# Patient Record
Sex: Male | Born: 1950 | Race: White | Hispanic: No | Marital: Single | Smoking: Former smoker
Health system: Southern US, Community
[De-identification: ages and names within clinical notes are randomized; demographics above are authoritative.]

## PROBLEM LIST (undated history)

## (undated) DIAGNOSIS — G4730 Sleep apnea, unspecified: Secondary | ICD-10-CM

## (undated) DIAGNOSIS — J4489 Other specified chronic obstructive pulmonary disease: Secondary | ICD-10-CM

## (undated) DIAGNOSIS — I10 Essential (primary) hypertension: Secondary | ICD-10-CM

## (undated) DIAGNOSIS — R911 Solitary pulmonary nodule: Secondary | ICD-10-CM

## (undated) DIAGNOSIS — E785 Hyperlipidemia, unspecified: Secondary | ICD-10-CM

## (undated) DIAGNOSIS — J449 Chronic obstructive pulmonary disease, unspecified: Secondary | ICD-10-CM

## (undated) HISTORY — DX: Solitary pulmonary nodule: R91.1

## (undated) HISTORY — DX: Sleep apnea, unspecified: G47.30

## (undated) HISTORY — PX: OTHER SURGICAL HISTORY: SHX169

## (undated) HISTORY — DX: Other specified chronic obstructive pulmonary disease: J44.89

## (undated) HISTORY — PX: ROTATOR CUFF REPAIR: SHX139

## (undated) HISTORY — DX: Essential (primary) hypertension: I10

## (undated) HISTORY — DX: Hyperlipidemia, unspecified: E78.5

## (undated) HISTORY — PX: APPENDECTOMY: SHX54

## (undated) HISTORY — DX: Chronic obstructive pulmonary disease, unspecified: J44.9

---

## 2007-04-23 ENCOUNTER — Ambulatory Visit (HOSPITAL_COMMUNITY): Admission: RE | Admit: 2007-04-23 | Discharge: 2007-04-24 | Payer: Self-pay | Admitting: Orthopaedic Surgery

## 2007-04-23 ENCOUNTER — Ambulatory Visit: Payer: Self-pay | Admitting: Emergency Medicine

## 2007-05-13 ENCOUNTER — Ambulatory Visit: Payer: Self-pay | Admitting: Emergency Medicine

## 2007-05-13 DIAGNOSIS — J984 Other disorders of lung: Secondary | ICD-10-CM

## 2007-05-13 DIAGNOSIS — J449 Chronic obstructive pulmonary disease, unspecified: Secondary | ICD-10-CM

## 2007-05-13 DIAGNOSIS — G4730 Sleep apnea, unspecified: Secondary | ICD-10-CM | POA: Insufficient documentation

## 2007-05-13 DIAGNOSIS — J4489 Other specified chronic obstructive pulmonary disease: Secondary | ICD-10-CM | POA: Insufficient documentation

## 2007-05-13 DIAGNOSIS — E785 Hyperlipidemia, unspecified: Secondary | ICD-10-CM

## 2007-05-13 DIAGNOSIS — I10 Essential (primary) hypertension: Secondary | ICD-10-CM | POA: Insufficient documentation

## 2007-05-14 ENCOUNTER — Ambulatory Visit: Payer: Self-pay | Admitting: Pulmonary Disease

## 2007-05-14 ENCOUNTER — Ambulatory Visit (HOSPITAL_BASED_OUTPATIENT_CLINIC_OR_DEPARTMENT_OTHER): Admission: RE | Admit: 2007-05-14 | Discharge: 2007-05-14 | Payer: Self-pay | Admitting: Emergency Medicine

## 2007-05-20 ENCOUNTER — Ambulatory Visit: Payer: Self-pay | Admitting: Emergency Medicine

## 2007-07-13 ENCOUNTER — Ambulatory Visit: Payer: Self-pay | Admitting: Emergency Medicine

## 2010-05-07 ENCOUNTER — Ambulatory Visit
Admission: RE | Admit: 2010-05-07 | Discharge: 2010-05-07 | Payer: Self-pay | Source: Home / Self Care | Attending: Emergency Medicine | Admitting: Emergency Medicine

## 2010-05-07 ENCOUNTER — Encounter: Payer: Self-pay | Admitting: Emergency Medicine

## 2010-05-07 DIAGNOSIS — R079 Chest pain, unspecified: Secondary | ICD-10-CM | POA: Insufficient documentation

## 2010-05-08 ENCOUNTER — Telehealth (INDEPENDENT_AMBULATORY_CARE_PROVIDER_SITE_OTHER): Payer: Self-pay | Admitting: *Deleted

## 2010-05-17 NOTE — Medication Information (Signed)
Summary: Enrollment form for [facility]  Enrollment form for [employer]   Imported By: Sherian Rein 05/11/2010 11:41:24  _____________________________________________________________________  External Attachment:    Type:   Image     Comment:   External Document

## 2010-05-17 NOTE — Progress Notes (Signed)
Summary: ?s from yesterday ov  Phone Note Call from Patient Call back at Home Phone [phone]   Caller: financee//kim [email or web address] Call For: byrum Reason for Call: Talk to Nurse Summary of Call: Wants to speak to nurse in ref to pt's ov yesterday. Initial call taken by: Darletta Moll,  May 08, 2010 3:10 PM  Follow-up for Phone Call        [place], spoke with Selena Batten. She is concerned bc pt is hurting in chest and having increased SOB but cxr showed lung noduled was no bigger.  She would like to know if Dr. Delton Coombes could give her a general prognosis/approx life expectancy for pt's condition where he is at now.  Advised I would ask RB if he is comfortable with this.    Also, cardiology referral was supposed to be place.  Pls advise if anyone in particular you want to refer pt to so I can place order.  Thanks! Follow-up by: Gweneth Dimitri RN,  May 08, 2010 3:38 PM  Additional Follow-up for Phone Call Additional follow up Details #1::        Spoke to [facility]. Reviewed CXR results - stable nodule. She is concerned about severity of disease, limitations he might expect. He has been using CPAP every night since our OV, is taking Spiriva once daily as a trial. Reviewed that he will need pneumovax and flu vax.  Additional Follow-up by: Leslye Peer MD,  May 10, 2010 11:26 AM

## 2010-05-17 NOTE — Assessment & Plan Note (Signed)
Summary: COPD, OSA, nodules, CP   Visit Type:  Follow-up  CC:  COPD.  OSA.  Last OV 07/13/2007.  Pt c/o incr SOB w/ exertion and at night while sleeping.  No cpap use x3-4 months..  History of Present Illness: 60 yo longtime smoker, COPD. Also w OSA, hx RLL and ? LLL nodules on prior CXR.   PFT - mod to severe COPD PSG - severe OSA, needs CPAP 9 + O2 [facility].   ROV 05/07/10 -- returns for eval, last time seen 3/09. COPD and OSA. Lost his insurance, hasn't been getting his meds for over a yr. Formerly on [employer]. Currently using wife's Proventil about once daily. Hasn't been using CPAP for about the last 6 months.   ROS - has had progressive SOB for 6 mo, significantly worse last 2-3 mo. He is having more exertional SOB, can't walk thru the store w his spouse. Has to stop to rest. No real wheeze, no real cough. He describes a tightness in his chest that goes to back - can happen with working, also when laying down. Has been waked from sleep feeling chest pressure, pain down arms. Snores without the CPAP. Feels that his heart races. Has gained about 15 lbs since last visit.     Preventive Screening-Counseling & Management  Alcohol-Tobacco     Smoking Status: quit     Packs/Day: 2.0     Year Started: 1958     Year Quit: 2002  Current Medications (verified): 1)  Oxygen ...Marland Kitchen 2lwhile Sleeping 2)  Aspirin 325 Mg Tabs (Aspirin) .Marland Kitchen.. 1 By Mouth Daily 3)  Aleve 220 Mg Tabs (Naproxen Sodium) .... As Needed  Allergies (verified): No Known Drug Allergies  Social History: Packs/Day:  2.0  Vital Signs:  Patient profile:   60 year old male Height:      68 inches (172.72 cm) Weight:      227.13 pounds (103.24 kg) BMI:     34.66 O2 Sat:      93 % on Room air Temp:     98.6 degrees F (37.00 degrees C) oral Pulse rate:   71 / minute BP sitting:   124 / 76  (left arm) Cuff size:   large  Vitals Entered By: Michel Bickers CMA (May 07, 2010 1:26 PM)  O2 Sat at Rest %:  93 O2  Flow:  Room air  Serial Vital Signs/Assessments:  Comments: 2:26 PM Ambulatory Pulse Oximetry  Resting; HR__70___    02 Sat__93%ra___  Lap1 (185 feet)   HR__104___   02 Sat__91%ra___ Lap2 (185 feet)   HR__93___   02 Sat__91%ra___    Lap3 (185 feet)   HR_____   02 Sat_____  ___Test Completed without Difficulty _x__Test Stopped due ZO:[id number] SOB   By: Vernie Murders   CC: COPD.  OSA.  Last OV 07/13/2007.  Pt c/o incr SOB w/ exertion and at night while sleeping.  No cpap use x3-4 months. Is Patient Diabetic? No Comments Medications reviewed with patient Michel Bickers CMA  May 07, 2010 1:37 PM   Physical Exam  General:  normal appearance and healthy appearing.  obese.   Head:  normocephalic and atraumatic Eyes:  conjunctiva and sclera clear Nose:  no deformity, discharge, inflammation, or lesions Mouth:  no deformity or lesions Neck:  no masses, thyromegaly, or abnormal cervical nodes Chest Wall:  no deformities noted Lungs:  distant but clear, no wheezes Heart:  regular rate and rhythm, S1, S2 without murmurs, rubs, gallops, or clicks  Pulses:  pulses normal Extremities:  no edema Psych:  alert and cooperative normal attention span and concentration, anxious.     Impression & Recommendations:  Problem # 1:  C O P D (ICD-496) Untreated.  - trial Spiriva once daily  - ? financial assist w/ Spiriva - walking oximetry today  Problem # 2:  SLEEP APNEA (ICD-780.57)  - encouraged him to restart CPAP 9cmH2O  Orders: Est. Patient Level IV (16109)  Problem # 3:  PULMONARY NODULE (ICD-518.89)  - repeat CXR today, may need CXR to further eval  Orders: Est. Patient Level IV (99214) T-2 View CXR (71020TC)  Problem # 4:  CHEST PAIN (ICD-786.50)  Etiology unclear, but he does have risk factors for CAD - will refer to cardiology for eval  Orders: Est. Patient Level IV (60454)  Medications Added to Medication List This Visit: 1)  Oxygen  ...Marland Kitchen 2lwhile  sleeping 2)  Aspirin 325 Mg Tabs (Aspirin) .Marland Kitchen.. 1 by mouth daily 3)  Aleve 220 Mg Tabs (Naproxen sodium) .... As needed  Other Orders: DME Referral (DME) Pulmonary Referral (Pulmonary)  Patient Instructions: 1)  Walking oximetry today showed that you do not need to wear with simple walking, buut you should use with heavier exertion.  2)  Start Spiriva 1 inhalation once daily until our next visit 3)  Start using your CPAP again every night 4)  We will refer you to cardiology to evaluate your chest pain 5)  CXR today 6)  Follow up with Dr Delton Coombes in 1 month.

## 2010-05-18 ENCOUNTER — Ambulatory Visit (INDEPENDENT_AMBULATORY_CARE_PROVIDER_SITE_OTHER): Payer: Self-pay | Admitting: Cardiology

## 2010-05-18 ENCOUNTER — Encounter: Payer: Self-pay | Admitting: Cardiology

## 2010-05-18 DIAGNOSIS — R072 Precordial pain: Secondary | ICD-10-CM

## 2010-05-18 DIAGNOSIS — I10 Essential (primary) hypertension: Secondary | ICD-10-CM

## 2010-05-23 NOTE — Assessment & Plan Note (Signed)
Summary: chest pain/libby ext 823/self pay/pt [phone]/ref byrum confi...   Vital Signs:  Patient profile:   60 year old male Height:      68 inches Weight:      228 pounds BMI:     34.79 Pulse rate:   61 / minute Resp:     14 per minute BP sitting:   138 / 82  (left arm)  Vitals Entered By: Kem Parkinson (May 18, 2010 9:03 AM)  CC:  pt compalins of chest pain pt took asprin which helped.  History of Present Illness: 60 year old male for evaluation of chest pain. No prior cardiac history. Patient states that in the past month he has had occasional chest pain. It is substernal and radiates to his arms bilaterally. There is no associated symptoms. It occurs at night. He finds it difficult to describe. It resolved in 15 minutes after taking aspirin. He does not have exertional chest pain. He does have dyspnea on exertion which he lungs. There is no orthopnea, PND, pedal edema or syncope. Because of the above we were asked to further evaluate.  Current Medications (verified): 1)  Oxygen ...Marland Kitchen 2lwhile Sleeping 2)  Aspirin 325 Mg Tabs (Aspirin) .Marland Kitchen.. 1 By Mouth Daily 3)  Aleve 220 Mg Tabs (Naproxen Sodium) .... As Needed  Allergies: No Known Drug Allergies  Past History:  Past Medical History: HYPERTENSION  HYPERLIPIDEMIA  PULMONARY NODULE  C O P D SLEEP APNEA   Past Surgical History: Shoulder SGY L rotator cuff R ankle fx and pinning Sinus SGY Appendectomy  Family History: Reviewed history from 05/13/2007 and no changes required. mat grandfather and uncle-emphysema mother-lung No premature CAD  Social History: Reviewed history from 05/13/2007 and no changes required. Patient states former smoker.  Pt is divorced with children.   Pt is a Animator. Moderate ETOH  Review of Systems       no fevers or chills, productive cough, hemoptysis, dysphasia, odynophagia, melena, hematochezia, dysuria, hematuria, rash, seizure activity, orthopnea, PND, pedal  edema, claudication. Remaining systems are negative.   Physical Exam  General:  Well developed/well nourished in NAD Skin warm/dry Patient not depressed No peripheral clubbing Back-normal HEENT-normal/normal eyelids Neck supple/normal carotid upstroke bilaterally; no bruits; no JVD; no thyromegaly chest - CTA/ normal expansion CV - RRR/normal S1 and S2; no murmurs, rubs or gallops;  PMI nondisplaced Abdomen -NT/ND, no HSM, no mass, + bowel sounds, no bruit 2+ femoral pulses, no bruits Ext-no edema, chords, 2+ DP Neuro-grossly nonfocal     Impression & Recommendations:  Problem # 1:  CHEST PAIN (ICD-786.50) Symptoms concerning. Multiple risk factors and abnormal electrocardiogram. I recommended cardiac catheterization today or definitive evaluation. However he declined at this point as he is concerned about the cost and also the potential complications. I explained the risk of the procedure and also the risk of undiagnosed coronary disease including death and myocardial infarction. I also offered a stress test as a possible alternative. He declined both. He will contact us if he changes his mind. We will continue with his aspirin and add  Lopressor 12.5 mg p.o. b.i.d. We will increase as tolerated. Follow pulmonary status closely following initiation. His updated medication list for this problem includes:    Aspirin 325 Mg Tabs (Aspirin) .Marland Kitchen... 1 by mouth daily    Metoprolol Tartrate 25 Mg Tabs (Metoprolol tartrate) .Marland Kitchen... Take one half  tablet by mouth twice a day  Problem # 2:  HYPERTENSION (ICD-401.9) Add Lopressor as described above. His updated  medication list for this problem includes:    Aspirin 325 Mg Tabs (Aspirin) .Marland Kitchen... 1 by mouth daily    Metoprolol Tartrate 25 Mg Tabs (Metoprolol tartrate) .Marland Kitchen... Take one half  tablet by mouth twice a day  Problem # 3:  HYPERLIPIDEMIA (ICD-272.4) Management per primary care.  Problem # 4:  C O P D (ICD-496) Followed by  pulmonary.  Problem # 5:  SLEEP APNEA (ICD-780.57)  Patient Instructions: 1)  Your physician has recommended you make the following change in your medication: START METOPROLOL TART 25MG  1/2 TABLET TWICE DAILY 2)  Your physician wants you to follow-up in:3 MONTHS   You will receive a reminder letter in the mail two months in advance. If you don't receive a letter, please call our office to schedule the follow-up appointment. 3)  Your physician has requested that you have a cardiac catheterization.  Cardiac catheterization is used to diagnose and/or treat various heart conditions. Doctors may recommend this procedure for a number of different reasons. The most common reason is to evaluate chest pain. Chest pain can be a symptom of coronary artery disease (CAD), and cardiac catheterization can show whether plaque is narrowing or blocking your heart's arteries. This procedure is also used to evaluate the valves, as well as measure the blood flow and oxygen levels in different parts of your heart.  For further information please visit [email or web address]  Please follow instruction sheet, as given. Prescriptions: METOPROLOL TARTRATE 25 MG TABS (METOPROLOL TARTRATE) Take one half  tablet by mouth twice a day  #60 x 12   Entered by:   Deliah Goody, RN   Authorized by:   Ferman Hamming, MD, [facility]   Signed by:   Deliah Goody, RN on 05/18/2010   Method used:   Electronically to        [facility]  [address] [phone]* (retail)       [address]       [place], [place]  [address]       Ph: [phone] or [phone]       Fax: [phone]   RxID:   [id number]       EKG  Procedure date:  05/18/2010  Findings:      Sinus rhythm with lateral T-wave inversion.

## 2010-06-11 ENCOUNTER — Encounter: Payer: Self-pay | Admitting: Emergency Medicine

## 2010-06-11 ENCOUNTER — Ambulatory Visit (INDEPENDENT_AMBULATORY_CARE_PROVIDER_SITE_OTHER): Payer: Self-pay | Admitting: Emergency Medicine

## 2010-06-11 DIAGNOSIS — J984 Other disorders of lung: Secondary | ICD-10-CM

## 2010-06-11 DIAGNOSIS — J4489 Other specified chronic obstructive pulmonary disease: Secondary | ICD-10-CM

## 2010-06-11 DIAGNOSIS — J449 Chronic obstructive pulmonary disease, unspecified: Secondary | ICD-10-CM

## 2010-06-11 DIAGNOSIS — Z23 Encounter for immunization: Secondary | ICD-10-CM

## 2010-06-11 DIAGNOSIS — G4730 Sleep apnea, unspecified: Secondary | ICD-10-CM

## 2010-06-21 NOTE — Assessment & Plan Note (Signed)
Summary: COPD, OSA, pulm nodule   Visit Type:  Follow-up  CC:  COPD.  OSA.  No breathing changes better or worse per patient.  Spouse says the patient seems to have more energy.  wants to discuss getting a chin strap for cpap.  Would like to get flu and pneumonia injection today.Marland Kitchen  History of Present Illness: 60 yo longtime smoker, COPD. Also w OSA, hx RLL and ? LLL nodules on prior CXR.   PFT - mod to severe COPD PSG - severe OSA, needs CPAP 9 + O2 [facility].   ROV 05/07/10 -- returns for eval, last time seen 3/09. COPD and OSA. Lost his insurance, hasn't been getting his meds for over a yr. Formerly on [employer]. Currently using wife's Proventil about once daily. Hasn't been using CPAP for about the last 6 months.   ROS - has had progressive SOB for 6 mo, significantly worse last 2-3 mo. He is having more exertional SOB, can't walk thru the store w his spouse. Has to stop to rest. No real wheeze, no real cough. He describes a tightness in his chest that goes to back - can happen with working, also when laying down. Has been waked from sleep feeling chest pressure, pain down arms. Snores without the CPAP. Feels that his heart races. Has gained about 15 lbs since last visit.   ROV 06/11/10 -- returns for f/u of COPD. We started Spiriva last time, he used it for about 4 weeks. He also uses nebulized albuterol or Proventil. He thinks that the Spiriva may have helped, not as much as the SABA. He has started using CPAP every night, feels that this has helped his energy level, his breathing.He was seen by Dr Jens Som and found to have abnormal ECG, was recommended to have cath but he is resistant, doesn't want to have this done.      Preventive Screening-Counseling & Management  Alcohol-Tobacco     Smoking Status: quit     Packs/Day: 2.0     Year Started: 1958     Year Quit: 2002  Current Medications (verified): 1)  Oxygen ...Marland Kitchen 2lwhile Sleeping 2)  Aspirin 325 Mg Tabs (Aspirin) .Marland Kitchen.. 1  By Mouth Daily 3)  Aleve 220 Mg Tabs (Naproxen Sodium) .... As Needed 4)  Metoprolol Tartrate 25 Mg Tabs (Metoprolol Tartrate) .... Take One Half  Tablet By Mouth Twice A Day  Allergies (verified): No Known Drug Allergies  Vital Signs:  Patient profile:   60 year old male Height:      68 inches (172.72 cm) Weight:      237 pounds (107.73 kg) BMI:     36.17 O2 Sat:      95 % on Room air Temp:     98.0 degrees F (36.67 degrees C) oral Pulse rate:   60 / minute BP sitting:   142 / 80  (left arm) Cuff size:   large  Vitals Entered By: Michel Bickers CMA (June 11, 2010 2:09 PM)  O2 Sat at Rest %:  95 O2 Flow:  Room air CC: COPD.  OSA.  No breathing changes better or worse per patient.  Spouse says the patient seems to have more energy.  wants to discuss getting a chin strap for cpap.  Would like to get flu and pneumonia injection today. Comments Medications reviewed with patient Michel Bickers [facility]  June 11, 2010 2:09 PM   Physical Exam  General:  normal appearance and healthy appearing.  obese.  Head:  normocephalic and atraumatic Eyes:  conjunctiva and sclera clear Nose:  no deformity, discharge, inflammation, or lesions Mouth:  no deformity or lesions Neck:  no masses, thyromegaly, or abnormal cervical nodes Chest Wall:  no deformities noted Lungs:  distant but clear, no wheezes Heart:  regular rate and rhythm, S1, S2 without murmurs, rubs, gallops, or clicks Pulses:  pulses normal Extremities:  no edema Psych:  alert and cooperative normal attention span and concentration, anxious.     Impression & Recommendations:  Problem # 1:  C O P D (ICD-496)  Problem # 2:  SLEEP APNEA (ICD-780.57)  Orders: Est. Patient Level IV (04540)  Problem # 3:  PULMONARY NODULE (ICD-518.89)  stable by CXR last time.   Orders: Est. Patient Level IV (98119)  Patient Instructions: 1)  We will continue your Spiriva once daily  2)  Use your albuterol as needed  3)  Wear your CPAP  every night 4)  Dr Delton Coombes agrees with Dr Jens Som that you would benefit from a cardiac catherization. Please contact the [facility] Cardiology office if you decide to proceed.  5)  Follow up with Dr Delton Coombes in 2 months or as needed   Appended Document: COPD, OSA, pulm nodule    Clinical Lists Changes  Orders: Added new Service order of Pneumococcal Vaccine (14782) - Signed Added new Service order of Admin 1st Vaccine (95621) - Signed Added new Service order of Flu Vaccine 46yrs + (30865) - Signed Added new Service order of Admin of Any Addtl Vaccine (78469) - Signed Observations: Added new observation of FLU VAXLOT: [id number] (06/11/2010 15:03) Added new observation of FLU VAX EXP: 10/13/2010 (06/11/2010 15:03) Added new observation of FLU VAXBY: Michel Bickers CMA (06/11/2010 15:03) Added new observation of FLU VAXRTE: IM (06/11/2010 15:03) Added new observation of FLU VAX DSE: 0.5 ml (06/11/2010 15:03) Added new observation of FLU VAXMFR: GlaxoSmithKline (06/11/2010 15:03) Added new observation of FLU VAX SITE: right deltoid (06/11/2010 15:03) Added new observation of FLU VAX: Fluvax 3+ (06/11/2010 15:03) Added new observation of PNEUMOVAXLOT: 1295Z (06/11/2010 15:03) Added new observation of PNEUMOVAXEXP: 08/03/2010 (06/11/2010 15:03) Added new observation of PNEUMOVAXBY: Michel Bickers CMA (06/11/2010 15:03) Added new observation of PNEUMOVAXRTE: IM (06/11/2010 15:03) Added new observation of PNEUMOVAXDOS: 0.5 ml (06/11/2010 15:03) Added new observation of PNEUMOVAXMFR: Merck (06/11/2010 15:03) Added new observation of PNEUMOVAXSIT: left deltoid (06/11/2010 15:03) Added new observation of PNEUMOVAX: Pneumovax (06/11/2010 15:03)       Immunizations Administered:  Pneumonia Vaccine:    Vaccine Type: Pneumovax    Site: left deltoid    Mfr: Merck    Dose: 0.5 ml    Route: IM    Given by: Michel Bickers CMA    Exp. Date: 08/03/2010    Lot #: [id number]  Influenza Vaccine # 1:     Vaccine Type: Fluvax 3+    Site: right deltoid    Mfr: GlaxoSmithKline    Dose: 0.5 ml    Route: IM    Given by: Michel Bickers CMA    Exp. Date: 10/13/2010    Lot #: [id number]  Flu Vaccine Consent Questions:    Do you have a history of severe allergic reactions to this vaccine? no    Any prior history of allergic reactions to egg and/or gelatin? no    Do you have a sensitivity to the preservative Thimersol? no    Do you have a past history of Guillan-Barre Syndrome? no    Do you currently have an acute febrile  illness? no    Have you ever had a severe reaction to latex? no    Vaccine information given and explained to patient? yes

## 2010-08-10 ENCOUNTER — Encounter: Payer: Self-pay | Admitting: Emergency Medicine

## 2010-08-13 ENCOUNTER — Ambulatory Visit: Payer: Self-pay | Admitting: Emergency Medicine

## 2010-08-28 NOTE — Op Note (Signed)
NAMEHAARIS, METALLO                ACCOUNT NO.:  [id number]   MEDICAL RECORD NO.:  [id number]          PATIENT TYPE:  OIB   LOCATION:  2620                         FACILITY:  [facility]   PHYSICIAN:  Claude Manges. Whitfield, M.D.DATE OF BIRTH:  [birth year]   DATE OF PROCEDURE:  04/23/2007  DATE OF DISCHARGE:                               OPERATIVE REPORT   PREOPERATIVE DIAGNOSES:  1. Rotator cuff tear, left shoulder with impingement.  2. Degenerative joint disease, acromioclavicular joint.   POSTOPERATIVE DIAGNOSES:  1. Rotator cuff tear, left shoulder with impingement.  2. Degenerative joint disease, acromioclavicular joint.  3. Synovitis and partial tearing of biceps tendon.   PROCEDURE:  1. Diagnostic arthroscopy left shoulder with debridement of biceps      tendon and synovectomy.  2. Arthroscopic subacromial decompression.  3. Arthroscopic distal clavicle resection.  4. Mini open rotator cuff tear repair.   SURGEON:  Claude Manges. Cleophas Dunker, M.D.   ASSISTANT:  Arlys John D. Petrarca, P.A.-C.   ANESTHESIA:  General with supplemental interscalene nerve block.   COMPLICATIONS:  None.   HISTORY:  60 year old gentleman has been followed the office for  problems referable to his left shoulder.  He has had trouble now for  approximately two to three months with the gradual onset of pain.  He  has had positive impingement with a painful arc.  X-rays reveal some  degenerative change of the [facility] joint.  He is had an MRI scan that  revealed a full-thickness tear of the anterior distal aspect of the  supraspinatus tendon with moderate tendinopathy throughout the remainder  of the tendon, degenerative change of the [facility] joint with impingement.  He  is now to have an arthroscopic evaluation and rotator cuff tear repair.   PROCEDURE:  With the patient comfortable one operating table and under  general orotracheal anesthesia, the patient was placed in semi-sitting  position with the shoulder frame.  The  left shoulder was then examined  under anesthesia without evidence of adhesive capsulitis or instability.   The shoulder was then prepped with DuraPrep from the base of neck  circumferentially below the elbow.  Sterile draping was performed.   Marking pen was used to outline the [facility] joint, the coracoid and the  acromion.  At a point a fingerbreadth posterior medial to the acromion,  a small stab wound was made.  The arthroscope was easily placed in the  shoulder joint.  Diagnostic arthroscopy revealed some partial tearing of  the base of the biceps tendon.  There was diffuse synovitis, some mild  chondromalacia and obvious full-thickness tear of the rotator cuff at  its attachment to the humeral head, specifically involving the  supraspinatus.  A second portal was established anteriorly.  The cannula  was inserted.  Shaving of the biceps tendon and synovectomy was  performed.  Labrum appeared to be intact.  There were no loose bodies.   The arthroscope was then placed in the subacromial space posteriorly.  The cannula subacromial space anteriorly and a third portal established  in the lateral subacromial space.  The subacromial space was then  evaluated with considerable bursal tissue.  This was removed with the  ArthroCare wand.  There was obvious impingement with overhang of the  anterior and lateral acromion and anterior-inferior acromioplasty was  performed with a 6 mm bur with a nice decompression.  The distal  clavicle was considerably arthritic with overhanging spurs and distal  clavicle resection was performed with a 6 mm bur.   A mini open rotator cuff tear repair was then performed.  About an inch  and half incision was made over the anterior aspect of the shoulder,  carried down to subcutaneous tissue.  The deltoid fascia was identified,  incised along the raphe .  The subacromial space was then entered.  The  fibers of the deltoid muscle were retracted with a self  retractor.   There was considerable bursal tissue which I removed.  The cuff tear was  in a V shape with the V directed superiorly and posteriorly.  The edges  were debrided.  I established bleeding bone along the humeral head  surface.  The tear was repaired from the V superiorly to eburnated bone  with interrupted 0-0 Ethibond.  A single Mitek anchor was used to secure  the base.  I had a very nice decompression by finger palpation.  The  wound was irrigated with saline solution.  The deltoid fascia closed  with running zero Vicryl, subcu with 2-0 Vicryl, skin closed with Steri-  Strips.  Sterile bulky dressing was applied followed by a sling.   The patient tolerated without complications.  He did have some trouble  breathing after the interscalene nerve block and it was felt that he  might be better off on a monitored bed overnight.      Claude Manges. Cleophas Dunker, M.D.  Electronically Signed     PWW/MEDQ  D:  04/23/2007  T:  04/24/2007  Job:  [id number]

## 2010-08-28 NOTE — Procedures (Signed)
Lee Gallagher, Lee Gallagher NO.:  [id number]   MEDICAL RECORD NO.:  [id number]          PATIENT TYPE:  OUT   LOCATION:  SLEEP CENTER                 FACILITY:  [facility]   PHYSICIAN:  Coralyn Helling, MD        DATE OF BIRTH:  1951-03-11   DATE OF STUDY:  05/14/2007                            NOCTURNAL POLYSOMNOGRAM   REFERRING PHYSICIAN:  Leslye Peer, MD   REFERRING PHYSICIAN:  Dr. Levy Pupa   VITAL SIGNS:  Height is 5 feet 9 inches tall.  Weight is 220 pounds.  BMI is 32.  Neck size is 18.   INDICATION FOR STUDY:  This is an individual who has a history of  hypertension as well as sleep disruption and excessive daytime  sleepiness.  He is referred to the Sleep Lab for evaluation of  hypersomnia with obstructive sleep apnea.   EPWORTH SLEEPINESS SCORE:  8.   MEDICATIONS:  1. Hyzaar.  2. Vytorin.  3. Combivent.  4. Lexapro.  5. Hydrocodone.   SLEEP ARCHITECTURE:  The patient followed a split night sleep protocol.  During the diagnostic portion of the test total sleep period was 180  minutes.  Total sleep time was 121 minutes.  Sleep efficiency was 61%.  Sleep latency was 17 minutes.  This portion of the test was notable for  the lack of  REM sleep and slow wave sleep.  The patient slept  predominantly in the nonsupine position.   During the titration portion of the test the total sleep period was 191  minutes, total sleep time was 183 minutes, sleep efficiency was 89%,  sleep latency was 13 minutes, REM latency was 48 minutes.  The patient  was observed during all stages of sleep and had an increase in the  percentage of REM sleep to 41% of the titration period.   RESPIRATORY DATA:  The average respiratory rate was 14.  During the  diagnostic portion of the test the overall apnea-hypopnea index was 29.  The events were exclusively obstructive in nature.  The supine apnea-  hypopnea index was 53.  The nonsupine apnea-hypopnea index was 18.  Loud  snoring  was noted by the technician.   During the therapeutic portion of the test the patient was titrated from  a CPAP pressure setting of 4 to 10 cm of water.  At a CPAP pressure  setting of 9 cm of water the apnea-hypopnea index was reduced to 0.  At  this pressure setting the patient was observed in REM sleep and supine  sleep, and snoring was eliminated.   OXYGEN DATA:  The baseline oxygenation was 93%.  The oxygen saturation  nadir was 87%.  At a CPAP pressure setting of 9 cm of water the oxygen  saturation nadir was 89%.   CARDIAC DATA:  The average heart rate was 69 and the rhythm strip showed  normal sinus rhythm.   MOVEMENT-PARASOMNIA:  The periodic limb movement index was 0.  The  patient had 1 restroom trip.   IMPRESSIONS-RECOMMENDATIONS:  This was a split-night study protocol.  During the diagnostic portion of the test the patient was found  to have  severe obstructive sleep apnea as demonstrated by an apnea-hypopnea  index of 30 and an oxygen saturation nadir of 84%.   During the therapeutic portion of the test at a CPAP pressure setting of  9 cm of water the apnea-hypopnea index was reduced to 0.  At this  pressure setting the patient was observed in REM sleep and supine sleep  and snoring was eliminated.   Of note is that the patient continued to have relatively low oxygen  saturation in spite of adequate control of his sleep-disordered  breathing.   What I would recommend is to start the patient on CPAP at 9 cm of water  and then have him undergo an overnight oximetry to determine if any  further interventions will be necessary.      Coralyn Helling, MD  Diplomat, [employer] Board [employer]  Electronically Signed     VS/MEDQ  D:  05/24/2007 13:39:51  T:  05/25/2007 13:47:57  Job:  [id number]   cc:   Leslye Peer, MD  [address]  [place], [place] [address]

## 2011-01-03 LAB — CBC
HCT: 39.4
Hemoglobin: 12.3 — ABNORMAL LOW
MCHC: 34.4
MCV: 93.6
Platelets: 297
Platelets: 302
RDW: 13.5
RDW: 13.6

## 2011-01-03 LAB — PROTIME-INR: INR: 0.9

## 2011-01-03 LAB — COMPREHENSIVE METABOLIC PANEL
AST: 26
Albumin: 4.1
Calcium: 9.5
Creatinine, Ser: 1
GFR calc non Af Amer: >60
Total Protein: 6.9

## 2011-01-03 LAB — BASIC METABOLIC PANEL
BUN: 16
Creatinine, Ser: 0.99
GFR calc non Af Amer: >60
Potassium: 4.7

## 2011-01-03 LAB — URINALYSIS, ROUTINE W REFLEX MICROSCOPIC
Nitrite: NEGATIVE
Specific Gravity, Urine: 1.027
Urobilinogen, UA: 0.2

## 2011-01-03 LAB — APTT: aPTT: 32

## 2011-07-30 ENCOUNTER — Other Ambulatory Visit: Payer: Self-pay | Admitting: Cardiology

## 2012-12-31 IMAGING — CR DG CHEST 2V
2 series · 2 of 2 positions shown · non-contrast
Comparison: 07/13/2007

CLINICAL DATA: evaluate for pulmonary nodule

CHEST - 2 VIEW

[view not recorded (1 of 2)]
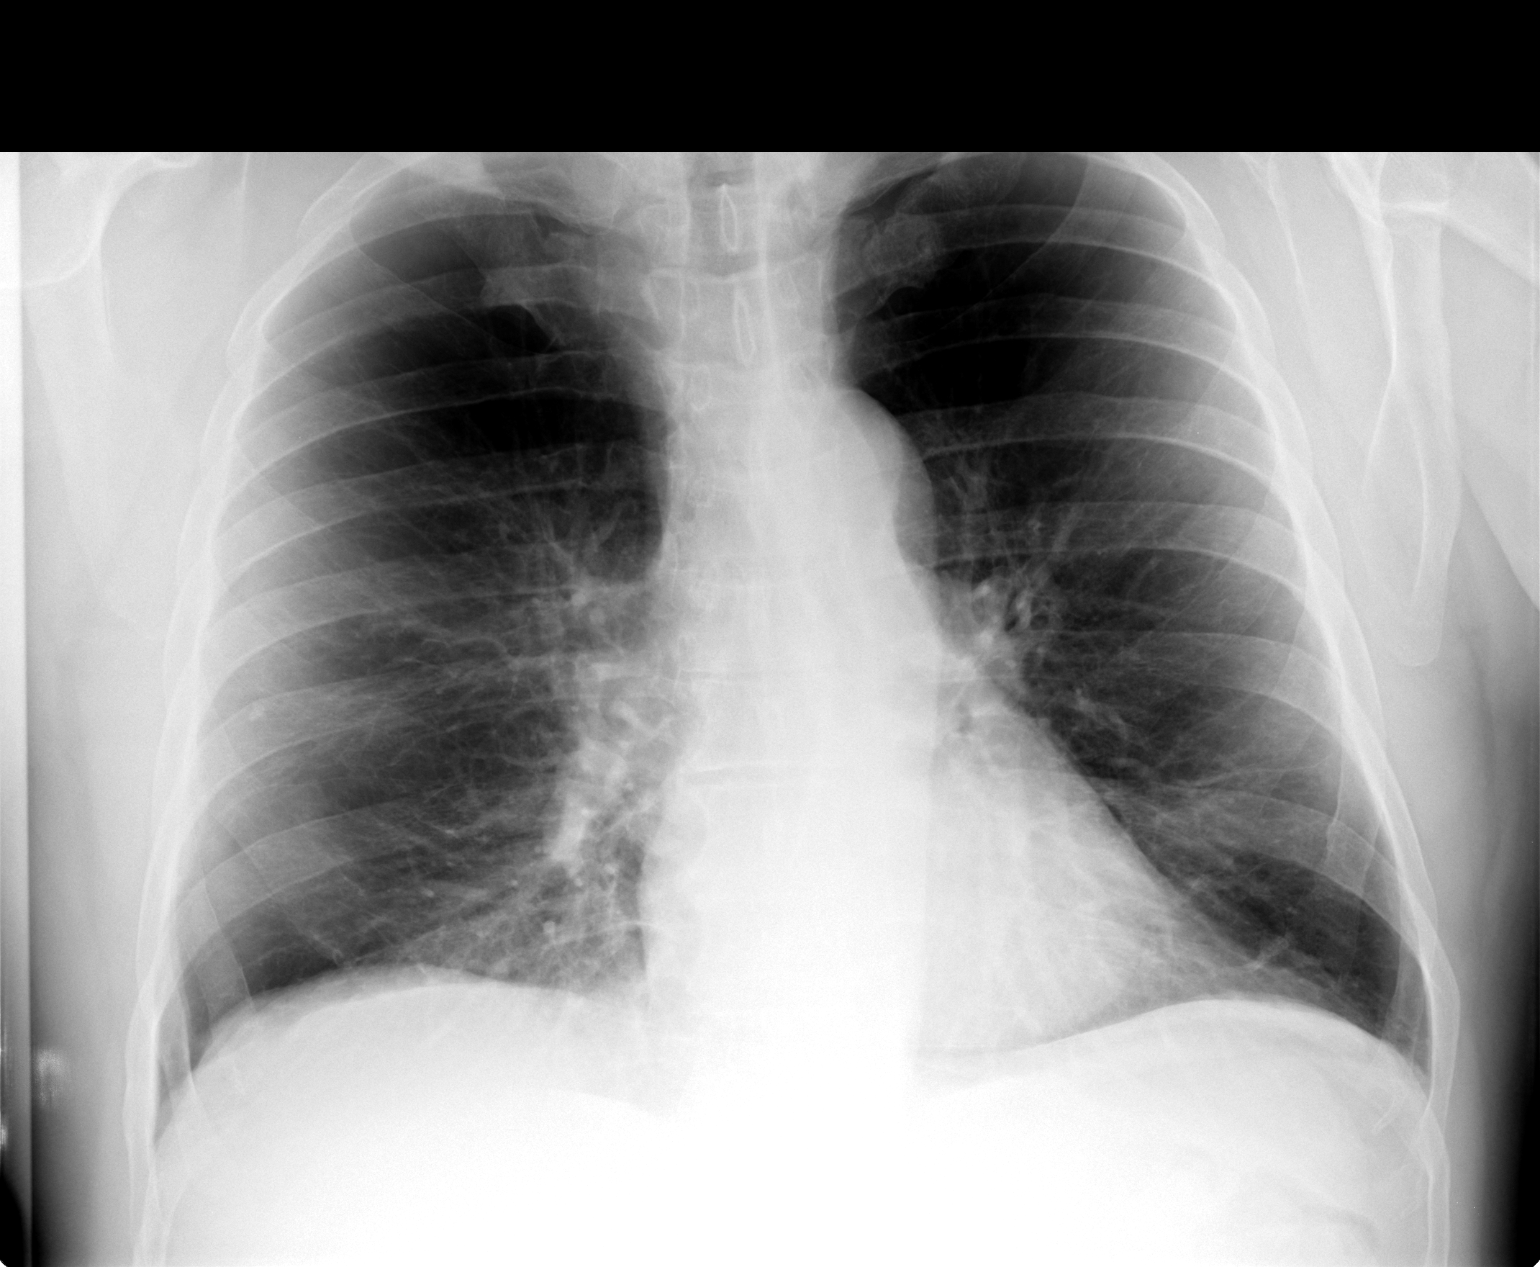

[view not recorded (2 of 2)]
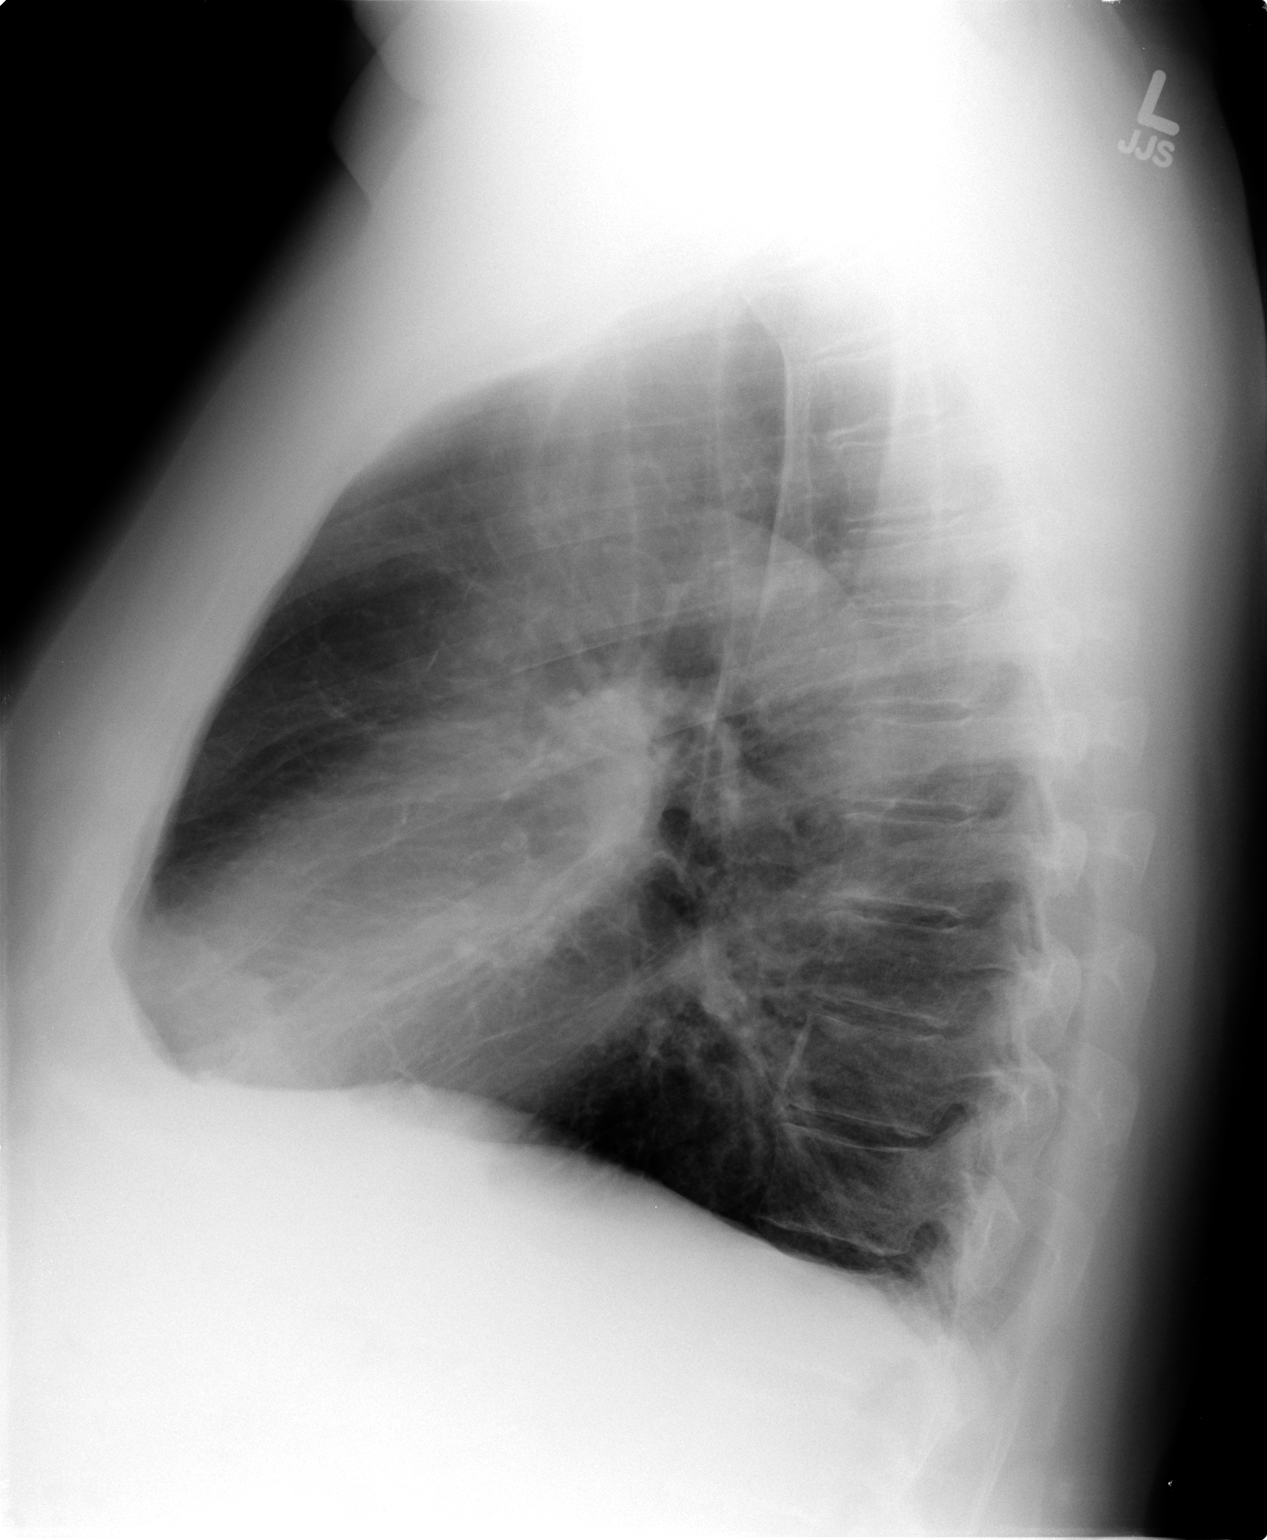

[2 of 2 positions shown; findings below may reference images not displayed]

FINDINGS: Heart size appears normal.

There is no pleural effusion or pulmonary edema.

There is a nodular density within the right lower lobe which is
unchanged from previous exam measuring approximately 4 mm.  Likely
granuloma.  The previously noted left upper lobe nodule is not seen
on today's radiograph.

Increased interstitial markings are noted consistent with the
history of COPD.
IMPRESSION: 1.  No acute cardiopulmonary abnormalities.
2.  COPD.
3.  Stable nodule in the right base.  Likely a granuloma.

## 2015-03-16 DEATH — deceased

## 2016-09-30 ENCOUNTER — Telehealth: Payer: Self-pay | Admitting: Family

## 2016-09-30 NOTE — Telephone Encounter (Signed)
Opened in error
